# Patient Record
Sex: Male | Born: 1985 | Race: Black or African American | Hispanic: No | Marital: Single | Smoking: Current some day smoker
Health system: Southern US, Community
[De-identification: ages and names within clinical notes are randomized; demographics above are authoritative.]

## PROBLEM LIST (undated history)

## (undated) HISTORY — PX: CARDIAC SURGERY: SHX584

---
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
## 2009-03-04 ENCOUNTER — Emergency Department (HOSPITAL_COMMUNITY): Admission: EM | Admit: 2009-03-04 | Discharge: 2009-03-04 | Payer: Self-pay | Admitting: Family Medicine

## 2009-03-08 ENCOUNTER — Emergency Department (HOSPITAL_COMMUNITY): Admission: EM | Admit: 2009-03-08 | Discharge: 2009-03-08 | Payer: Self-pay | Admitting: Family Medicine

## 2012-11-09 ENCOUNTER — Emergency Department (HOSPITAL_COMMUNITY): Payer: Self-pay

## 2012-11-09 ENCOUNTER — Encounter (HOSPITAL_COMMUNITY): Payer: Self-pay | Admitting: *Deleted

## 2012-11-09 ENCOUNTER — Emergency Department (HOSPITAL_COMMUNITY)
Admission: EM | Admit: 2012-11-09 | Discharge: 2012-11-09 | Disposition: A | Discharge status: Home / Self Care | Payer: Self-pay | Attending: Emergency Medicine | Admitting: Emergency Medicine

## 2012-11-09 DIAGNOSIS — S92353A Displaced fracture of fifth metatarsal bone, unspecified foot, initial encounter for closed fracture: Secondary | ICD-10-CM

## 2012-11-09 DIAGNOSIS — Y939 Activity, unspecified: Secondary | ICD-10-CM | POA: Insufficient documentation

## 2012-11-09 DIAGNOSIS — Z87891 Personal history of nicotine dependence: Secondary | ICD-10-CM | POA: Insufficient documentation

## 2012-11-09 DIAGNOSIS — Y929 Unspecified place or not applicable: Secondary | ICD-10-CM | POA: Insufficient documentation

## 2012-11-09 DIAGNOSIS — W108XXA Fall (on) (from) other stairs and steps, initial encounter: Secondary | ICD-10-CM | POA: Insufficient documentation

## 2012-11-09 DIAGNOSIS — S92309A Fracture of unspecified metatarsal bone(s), unspecified foot, initial encounter for closed fracture: Secondary | ICD-10-CM | POA: Insufficient documentation

## 2012-11-09 MED ORDER — OXYCODONE-ACETAMINOPHEN 5-325 MG PO TABS: 2.0000 | ORAL_TABLET | ORAL | Status: AC | PRN

## 2012-11-09 NOTE — ED Notes (Signed)
Pt. Demonstrated proper technique with crutches

## 2012-11-09 NOTE — ED Notes (Signed)
To ED for eval of right foot pain past injury a cple days ago. Able to walk with limp. States he felt better once swelling decreased and then hit something with foot yesterday bringing pain back.

## 2012-11-09 NOTE — Progress Notes (Signed)
Orthopedic Tech Progress Note Patient Details:  Bill Frost 02-Oct-1986 [id number]  Ortho Devices Type of Ortho Device: Crutches;Post (short leg) splint;Ace wrap Ortho Device/Splint Location: ((R) LE Ortho Device/Splint Interventions: Application   Jennye Moccasin 11/09/2012, 4:22 PM

## 2012-11-09 NOTE — ED Provider Notes (Signed)
History  This chart was scribed for Bill Octave, MD by Bennett Scrape, ED Scribe. This patient was seen in room A10C/A10C and the patient's care was started at 3:44 PM.  CSN: [id number]  Arrival date & time 11/09/12  1236   First MD Initiated Contact with Patient 11/09/12 1544      Chief Complaint  Patient presents with  . Foot Pain     The history is provided by the patient. No language interpreter was used.    Bill Frost is a 27 y.o. male who presents to the Emergency Department complaining of 4 days of sudden onset, non-changing, constant right foot pain described as throbbing with associated swelling that started after a fall. Pt states that he missed a step and fell down 2 or 3 stairs landing with the lateral aspect of his right foot on the floor. He reports that he was barefoot at the time. He reports that he has been ambulating on the foot for the past 2 days without difficulty. He denies any prior injuries to the right foot. He denies taking OTC medications at home to improve symptoms but states that he has been elevating the foot at home with improvement in the swelling. He denies head trauma, LOC, neck pain, numbness, HA, chest pain and back pain as associated symptoms. He does not have a h/o chronic medical conditions and is a former smoker but denies alcohol use.  History reviewed. No pertinent past medical history.  History reviewed. No pertinent past surgical history.  History reviewed. No pertinent family history.  History  Substance Use Topics  . Smoking status: Former Games developer  . Smokeless tobacco: Not on file  . Alcohol Use: No      Review of Systems  A complete 10 system review of systems was obtained and all systems are negative except as noted in the HPI and PMH.   Allergies  Review of patient's allergies indicates no known allergies.  Home Medications  No current outpatient prescriptions on file.  Triage Vitals: BP 143/69  Pulse 63  Temp  97.9 F (36.6 C) (Oral)  Resp 16  SpO2 99%  Physical Exam  Nursing note and vitals reviewed. Constitutional: He is oriented to person, place, and time. He appears well-developed and well-nourished. No distress.  HENT:  Head: Normocephalic and atraumatic.  Eyes: EOM are normal.  Neck: Neck supple. No tracheal deviation present.  Cardiovascular: Normal rate.   Pulmonary/Chest: Effort normal. No respiratory distress.  Musculoskeletal: He exhibits edema and tenderness.       +2 DP and PT pulses, edema over the right lateral foot, no pain at base of 5th metatarsal, pain along 5th metatarsal shaft, no proximal fibula pain  Neurological: He is alert and oriented to person, place, and time.  Skin: Skin is warm and dry.  Psychiatric: He has a normal mood and affect. His behavior is normal.    ED Course  Procedures (including critical care time)  DIAGNOSTIC STUDIES: Oxygen Saturation is 99% on room air, normal by my interpretation.    COORDINATION OF CARE: 3:51 PM- Informed pt of radiology results. Discussed discharge plan which includes crutches and a splint with pt at bedside and pt agreed to plan. Also advised pt to follow up with orthopedic surgeon and to return if concerning symptoms develop. Pt decline pain medications  4:17 PM-Consult complete with Dr. Ranell Patrick, orthopedist. Patient case explained and discussed. He agrees to f/u with the pt in office. Call ended at 4:18 PM.  Labs  Reviewed - No data to display Dg Foot Complete Right  11/09/2012  *RADIOLOGY REPORT*  Clinical Data: Injured right foot.  RIGHT FOOT COMPLETE - 3+ VIEW  Comparison: None  Findings: There is a mildly displaced oblique coursing midshaft fracture of the fifth metatarsal.  The joint spaces are maintained. No other fractures are seen.  IMPRESSION: Fifth metatarsal shaft fracture.   Original Report Authenticated By: Rudie Meyer, M.D.      No diagnosis found.    MDM  Fall down stairs 4 days ago complaining of  right lateral foot pain. Did not hit head or lose consciousness. Denies any other injuries.  Tender to palpation over right fifth metatarsal. Intact pulses. Intact motor and sensory function. Compartments soft. Full range of motion of toes and ankle without pain.  Splint, NWB, pain control, follow up with Dr. Debby Bud.  I personally performed the services described in this documentation, which was scribed in my presence. The recorded information has been reviewed and is accurate.       Bill Octave, MD 11/09/12 [phone]

## 2012-12-11 ENCOUNTER — Ambulatory Visit: Payer: Self-pay | Admitting: Internal Medicine

## 2012-12-11 DIAGNOSIS — Z0289 Encounter for other administrative examinations: Secondary | ICD-10-CM

## 2013-05-18 ENCOUNTER — Emergency Department (HOSPITAL_COMMUNITY)
Admission: EM | Admit: 2013-05-18 | Discharge: 2013-05-18 | Disposition: A | Discharge status: Home / Self Care | Payer: Self-pay | Attending: Emergency Medicine | Admitting: Emergency Medicine

## 2013-05-18 ENCOUNTER — Emergency Department (HOSPITAL_COMMUNITY): Payer: Self-pay

## 2013-05-18 ENCOUNTER — Encounter (HOSPITAL_COMMUNITY): Payer: Self-pay | Admitting: *Deleted

## 2013-05-18 DIAGNOSIS — R112 Nausea with vomiting, unspecified: Secondary | ICD-10-CM | POA: Insufficient documentation

## 2013-05-18 DIAGNOSIS — K8020 Calculus of gallbladder without cholecystitis without obstruction: Secondary | ICD-10-CM | POA: Insufficient documentation

## 2013-05-18 DIAGNOSIS — F17200 Nicotine dependence, unspecified, uncomplicated: Secondary | ICD-10-CM | POA: Insufficient documentation

## 2013-05-18 DIAGNOSIS — R0789 Other chest pain: Secondary | ICD-10-CM | POA: Insufficient documentation

## 2013-05-18 LAB — COMPREHENSIVE METABOLIC PANEL
AST: 32 U/L (ref 0–37)
Albumin: 3.6 g/dL (ref 3.5–5.2)
BUN: 10 mg/dL (ref 6–23)
Calcium: 9.5 mg/dL (ref 8.4–10.5)
Chloride: 97 mEq/L (ref 96–112)
Creatinine, Ser: 1 mg/dL (ref 0.50–1.35)
Total Protein: 7.5 g/dL (ref 6.0–8.3)

## 2013-05-18 LAB — CBC WITH DIFFERENTIAL/PLATELET
Basophils Absolute: 0 10*3/uL (ref 0.0–0.1)
Basophils Relative: 0 % (ref 0–1)
Eosinophils Absolute: 0 10*3/uL (ref 0.0–0.7)
Eosinophils Relative: 0 % (ref 0–5)
HCT: 40.7 % (ref 39.0–52.0)
MCH: 28 pg (ref 26.0–34.0)
MCHC: 33.9 g/dL (ref 30.0–36.0)
Monocytes Absolute: 1.1 10*3/uL — ABNORMAL HIGH (ref 0.1–1.0)
Neutro Abs: 10.4 10*3/uL — ABNORMAL HIGH (ref 1.7–7.7)
RDW: 13.3 % (ref 11.5–15.5)

## 2013-05-18 LAB — POCT I-STAT TROPONIN I: Troponin i, poc: 0.01 ng/mL (ref 0.00–0.08)

## 2013-05-18 LAB — LIPASE, BLOOD: Lipase: 35 U/L (ref 11–59)

## 2013-05-18 MED ORDER — MORPHINE SULFATE 4 MG/ML IJ SOLN
4.0000 mg | Freq: Once | INTRAMUSCULAR | Status: AC
  Administered 2013-05-18: 4 mg via INTRAVENOUS
  Filled 2013-05-18: qty 1

## 2013-05-18 MED ORDER — ONDANSETRON HCL 4 MG/2ML IJ SOLN
4.0000 mg | Freq: Once | INTRAMUSCULAR | Status: AC
  Administered 2013-05-18: 4 mg via INTRAVENOUS
  Filled 2013-05-18: qty 2

## 2013-05-18 MED ORDER — HYDROCODONE-ACETAMINOPHEN 5-325 MG PO TABS: ORAL_TABLET | ORAL | Status: AC

## 2013-05-18 MED ORDER — SODIUM CHLORIDE 0.9 % IV SOLN
Freq: Once | INTRAVENOUS | Status: AC
  Administered 2013-05-18: 04:00:00 via INTRAVENOUS

## 2013-05-18 MED ORDER — KETOROLAC TROMETHAMINE 30 MG/ML IJ SOLN
30.0000 mg | Freq: Once | INTRAMUSCULAR | Status: AC
  Administered 2013-05-18: 30 mg via INTRAVENOUS
  Filled 2013-05-18: qty 1

## 2013-05-18 NOTE — ED Provider Notes (Signed)
CSN: [id number]     Arrival date & time 05/18/13  0058 History     First MD Initiated Contact with Patient 05/18/13 [phone]     Chief Complaint  Patient presents with  . Back Pain  . Chest Pain   (Consider location/radiation/quality/duration/timing/severity/associated sxs/prior Treatment) HPI Comments: Patient states he was sitting, watching TV when he suddenly had left posterior chest pain, radiating up into his neck.  That was excruciating, causing him to have 2 episodes of vomiting.  This occurred approximately one hour after eating D[employer]  for dinner.  He does not remember ever having discomfort like this.  Since arriving in the emergency room and has gotten better without any particular intervention, but he still complaining of left posterior neck discomfort. There is a family history on his mother side of hypertension, and diabetes, but no personal history of any medical conditions.  Does not take medication on a regular basis.  Has not traveled does smoke cigarettes on a daily basis  Patient is a 27 y.o. male presenting with back pain and chest pain. The history is provided by the patient.  Back Pain Quality:  Aching Radiates to:  Does not radiate Pain severity:  Moderate Onset quality:  Sudden Duration:  3 hours Timing:  Constant Progression:  Improving Chronicity:  New Context: not emotional stress, not falling, not jumping from heights, not lifting heavy objects, not occupational injury, not physical stress and not recent illness   Relieved by:  None tried Ineffective treatments:  None tried Associated symptoms: chest pain   Associated symptoms: no abdominal pain, no fever, no headaches and no weakness   Chest Pain Associated symptoms: back pain, nausea and vomiting   Associated symptoms: no abdominal pain, no cough, no dizziness, no fever, no headache, no shortness of breath and no weakness     History reviewed. No pertinent past medical history. Past Surgical History   Procedure Laterality Date  . Cardiac surgery      at age 14   History reviewed. No pertinent family history. History  Substance Use Topics  . Smoking status: Current Some Day Smoker  . Smokeless tobacco: Not on file  . Alcohol Use: Yes     Comment: rarely    Review of Systems  Constitutional: Negative for fever.  Respiratory: Negative for cough and shortness of breath.   Cardiovascular: Positive for chest pain. Negative for leg swelling.  Gastrointestinal: Positive for nausea and vomiting. Negative for abdominal pain and constipation.  Musculoskeletal: Positive for back pain.  Neurological: Negative for dizziness, weakness and headaches.  All other systems reviewed and are negative.    Allergies  Review of patient's allergies indicates no known allergies.  Home Medications   Current Outpatient Rx  Name  Route  Sig  Dispense  Refill  . oxyCODONE-acetaminophen (PERCOCET/ROXICET) 5-325 MG per tablet   Oral   Take 2 tablets by mouth every 4 (four) hours as needed for pain.   15 tablet   0   . HYDROcodone-acetaminophen (NORCO/VICODIN) 5-325 MG per tablet      Take 1-2 tablets every 6 hours as needed for severe pain   8 tablet   0    BP 128/75  Pulse 106  Temp(Src) 98 F (36.7 C) (Oral)  Resp 15  SpO2 99% Physical Exam  Nursing note and vitals reviewed. Constitutional: He is oriented to person, place, and time. He appears well-developed and well-nourished.  HENT:  Head: Normocephalic.  Eyes: Pupils are equal, round, and  reactive to light.  Neck: Normal range of motion.  Cardiovascular: Normal rate and regular rhythm.   Pulmonary/Chest: Effort normal and breath sounds normal.  Abdominal: Soft. Bowel sounds are normal. He exhibits no distension. There is no tenderness.  Musculoskeletal: Normal range of motion.  Neurological: He is alert and oriented to person, place, and time.  Skin: Skin is warm. No rash noted. No erythema.    ED Course   Procedures  (including critical care time)  Labs Reviewed  CBC WITH DIFFERENTIAL - Abnormal; Notable for the following:    WBC 12.7 (*)    Neutrophils Relative % 82 (*)    Neutro Abs 10.4 (*)    Lymphocytes Relative 9 (*)    Monocytes Absolute 1.1 (*)    All other components within normal limits  COMPREHENSIVE METABOLIC PANEL - Abnormal; Notable for the following:    Sodium 134 (*)    Glucose, Bld 132 (*)    Total Bilirubin 0.1 (*)    All other components within normal limits  LIPASE, BLOOD  POCT I-STAT TROPONIN I   Dg Chest 2 View  05/18/2013   *RADIOLOGY REPORT*  Clinical Data: Chest pain on inspiration.  CHEST - 2 VIEW  Comparison: None.  Findings: The lungs are clear.  Heart size is normal.  No pneumothorax or pleural fluid.  No focal bony abnormality.  IMPRESSION: No acute disease.   Original Report Authenticated By: Holley Dexter, M.D.   US Abdomen Complete  05/18/2013   *RADIOLOGY REPORT*  Clinical Data:  Back and chest pain.  COMPLETE ABDOMINAL ULTRASOUND  Comparison:  None.  Findings:  Gallbladder:  A single small gallstone measuring 0.7 cm in diameter is identified.  There is no gallbladder wall thickening or pericholecystic fluid.  Sonographer reports negative Murphy's sign.  Common bile duct:  Measures 0.3 cm.  Liver:  No focal lesion identified.  Within normal limits in parenchymal echogenicity.  IVC:  Appears normal.  Pancreas:  No focal abnormality seen.  Spleen:  Measures 8.1 cm and appears normal.  Right Kidney:  Measures 12.8 cm and appears normal.  Left Kidney:  Measures 10.7 cm and appears normal.  Abdominal aorta:  No aneurysm identified.  IMPRESSION: Single small gallstone.  Negative for cholecystitis.   Original Report Authenticated By: Holley Dexter, M.D.   1. Cholelithiasis     MDM     Arman Filter, NP 05/19/13 [phone]

## 2013-05-18 NOTE — ED Notes (Signed)
Pt anxious, tearful, restless, hyperventilating, c/o back pain b/w shoulder blades, onset 2 hrs ago, was just sitting in chair, also reports CP, and numbness in fingers (to elbows).

## 2013-05-18 NOTE — ED Provider Notes (Signed)
Medical screening examination/treatment/procedure(s) were performed by non-physician practitioner and as supervising physician I was immediately available for consultation/collaboration.  Olivia Mackie, MD 05/18/13 [phone]

## 2013-05-18 NOTE — ED Provider Notes (Signed)
6:05 AM Handoff from Manus Rudd NP -- patient with CP radiating to back -- awaiting [place] to r/o biliary etiology. Per previous provider, do not suspect PE, ACS. Labs show mild increase in WBC count. CXR neg. Awaiting results and then likely d/c to home.   6:27 AM Spoke with patient. Informed of gallstone. Counseled on avoidance of foods that make it worse. Reviewed results. Surgery referral given.  Patient counseled on use of narcotic pain medications. Counseled not to combine these medications with others containing tylenol. Urged not to drink alcohol, drive, or perform any other activities that requires focus while taking these medications. The patient verbalizes understanding and agrees with the plan.  Re-exam: pt NAD; abd soft, NT.   The patient was urged to return to the Emergency Department immediately with worsening of current symptoms, worsening abdominal pain, persistent vomiting, blood noted in stools, fever, or any other concerns. The patient verbalized understanding.     Renne Crigler, PA-C 05/18/13 [phone]

## 2013-05-19 NOTE — ED Provider Notes (Signed)
Medical screening examination/treatment/procedure(s) were performed by non-physician practitioner and as supervising physician I was immediately available for consultation/collaboration.  Olivia Mackie, MD 05/19/13 [phone]

## 2014-02-05 IMAGING — CR DG FOOT COMPLETE 3+V*R*
3 series · 3 of 3 positions shown · non-contrast
Comparison: None

CLINICAL DATA: Injured right foot.

RIGHT FOOT COMPLETE - 3+ VIEW

[t foot ap right]
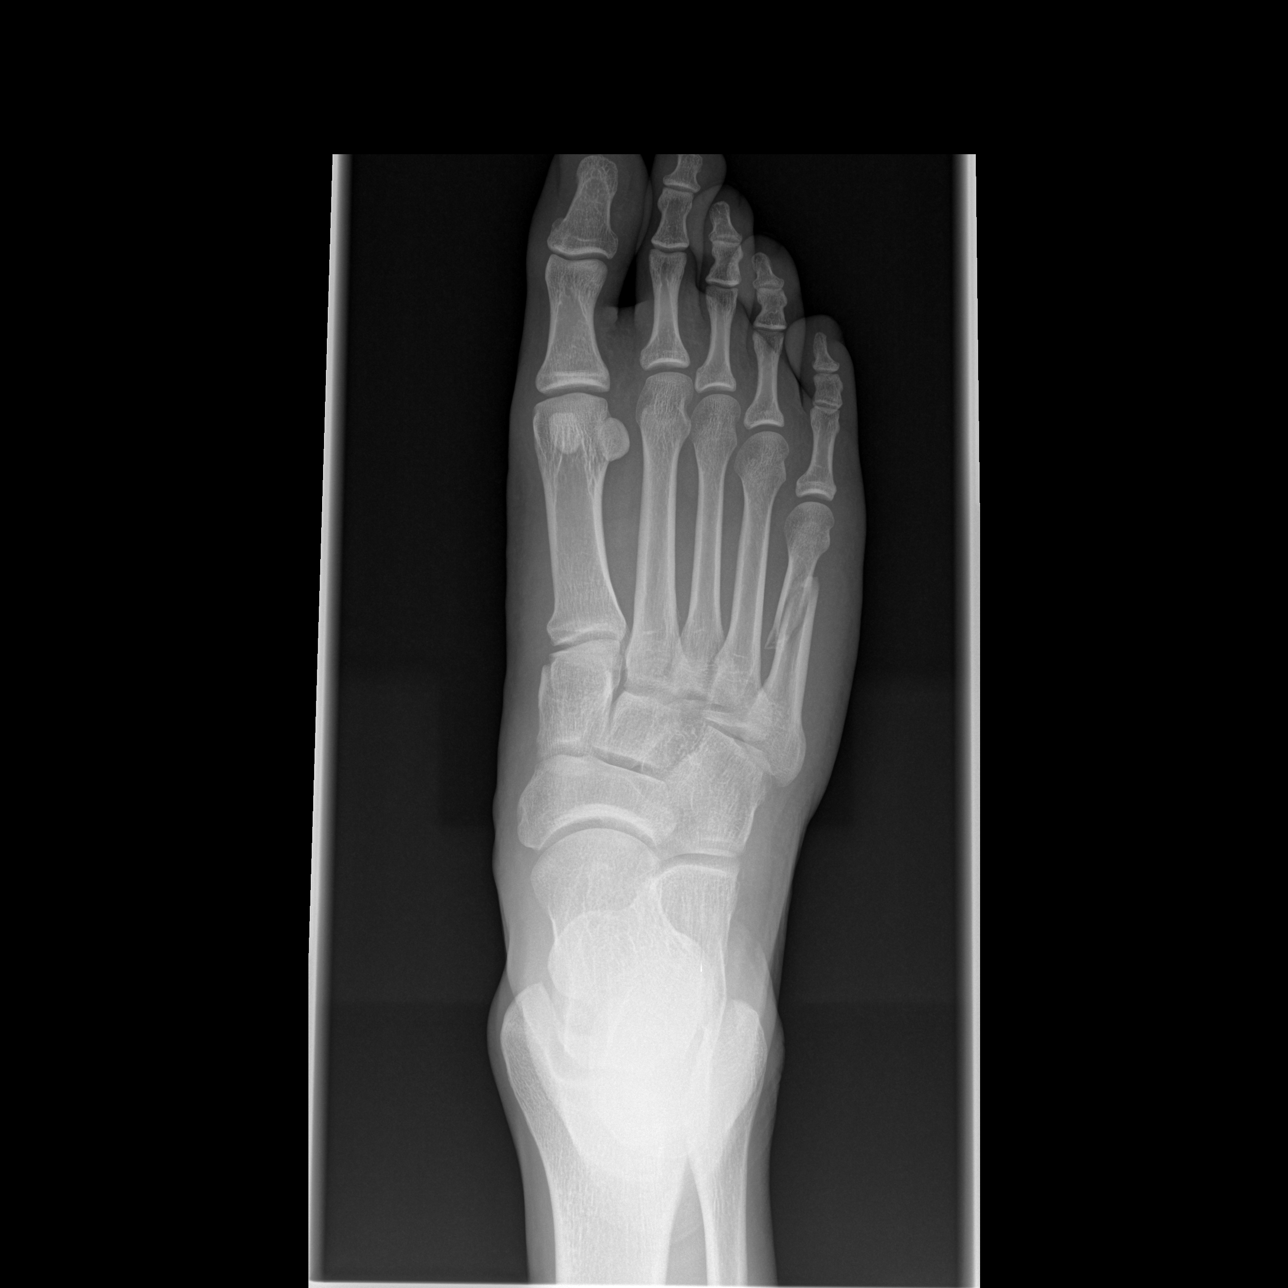

[t foot oblique right]
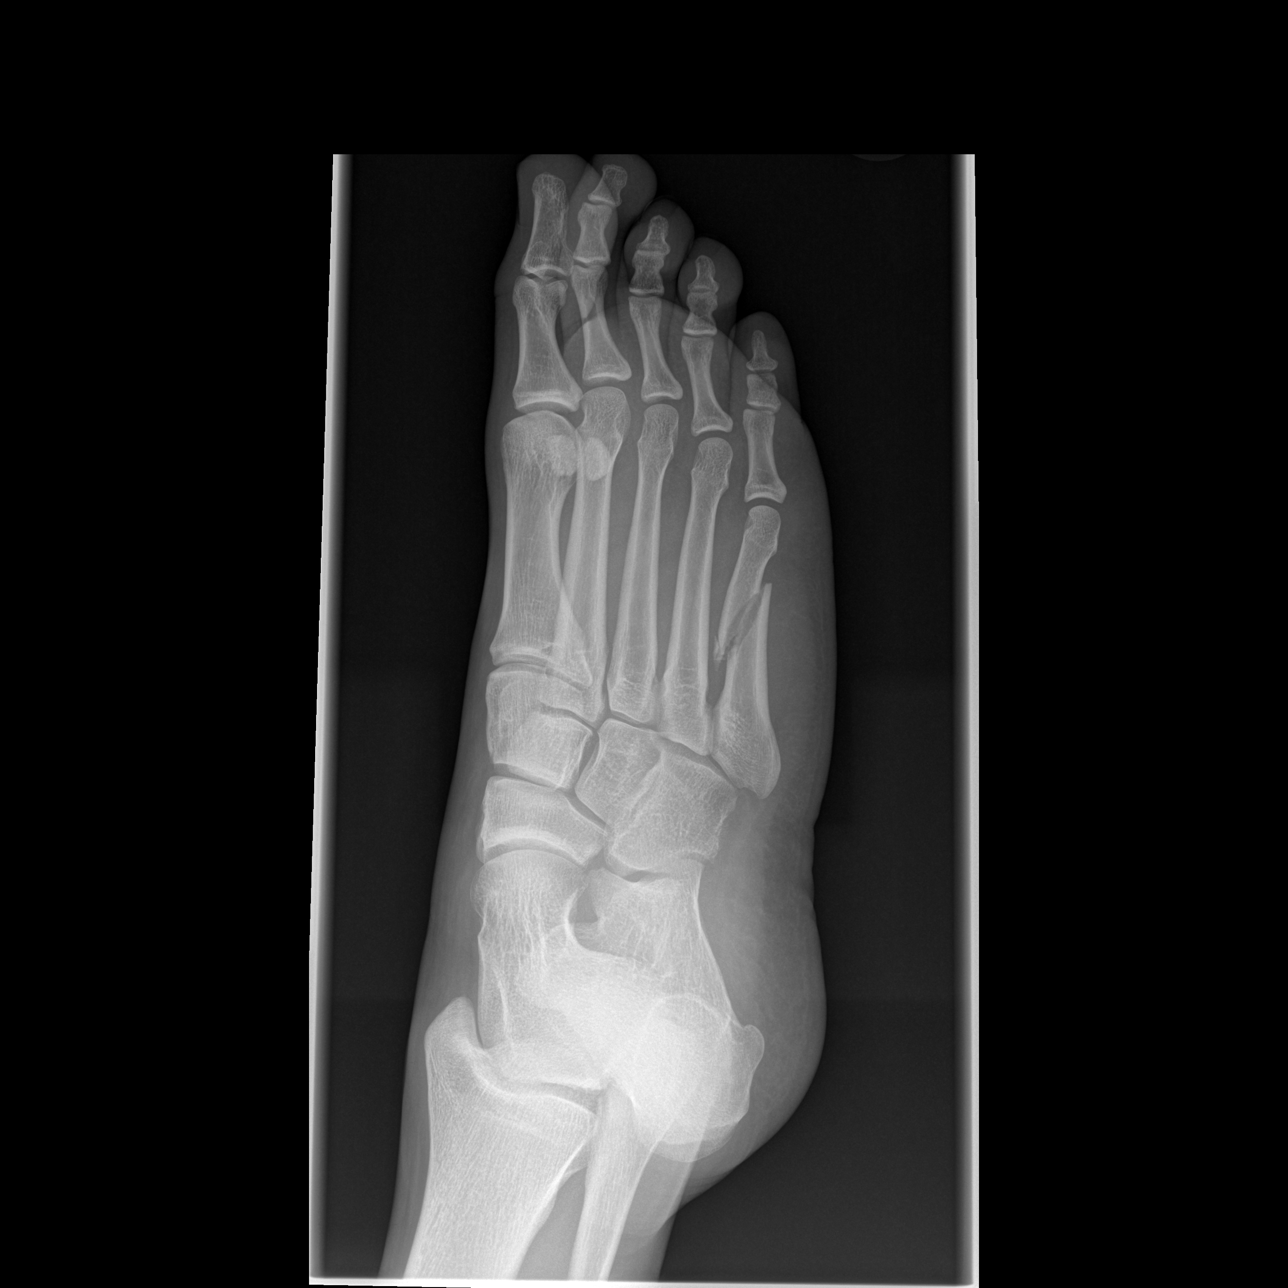

[t foot lat right]
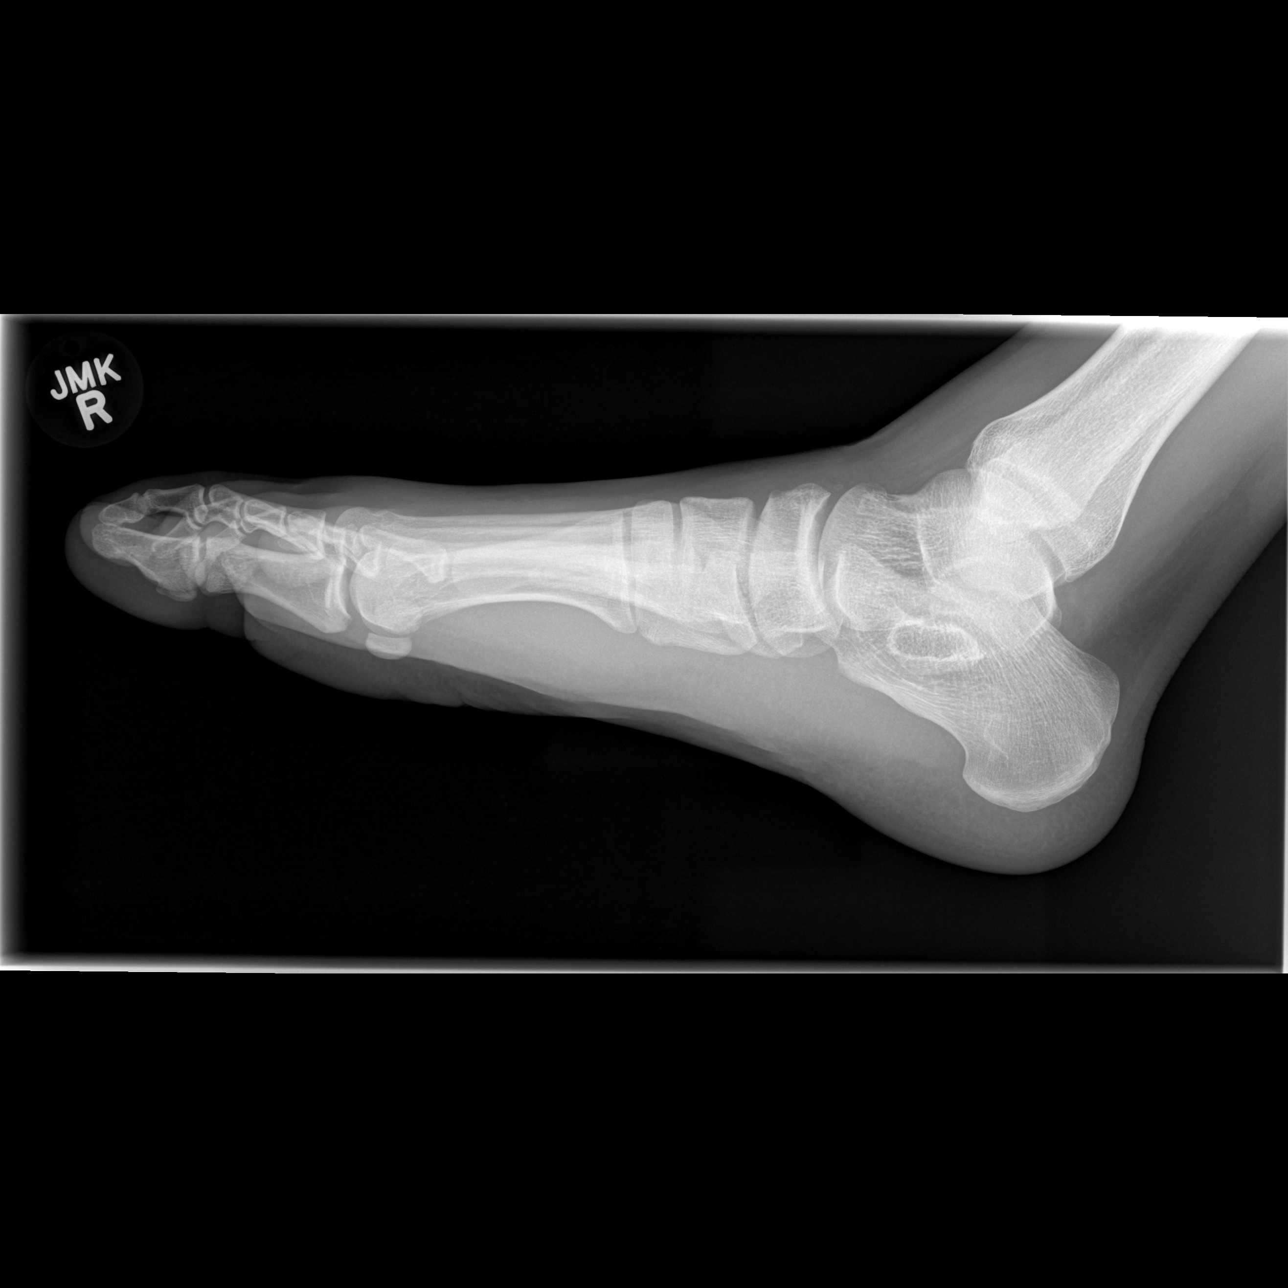

[3 of 3 positions shown; findings below may reference images not displayed]

FINDINGS: There is a mildly displaced oblique coursing midshaft
fracture of the fifth metatarsal.  The joint spaces are maintained.
No other fractures are seen.
IMPRESSION: Fifth metatarsal shaft fracture.

## 2014-08-14 IMAGING — CR DG CHEST 2V
2 series · 2 of 2 positions shown · non-contrast
Comparison: None.

CLINICAL DATA: Chest pain on inspiration.

CHEST - 2 VIEW

[w chest pa]
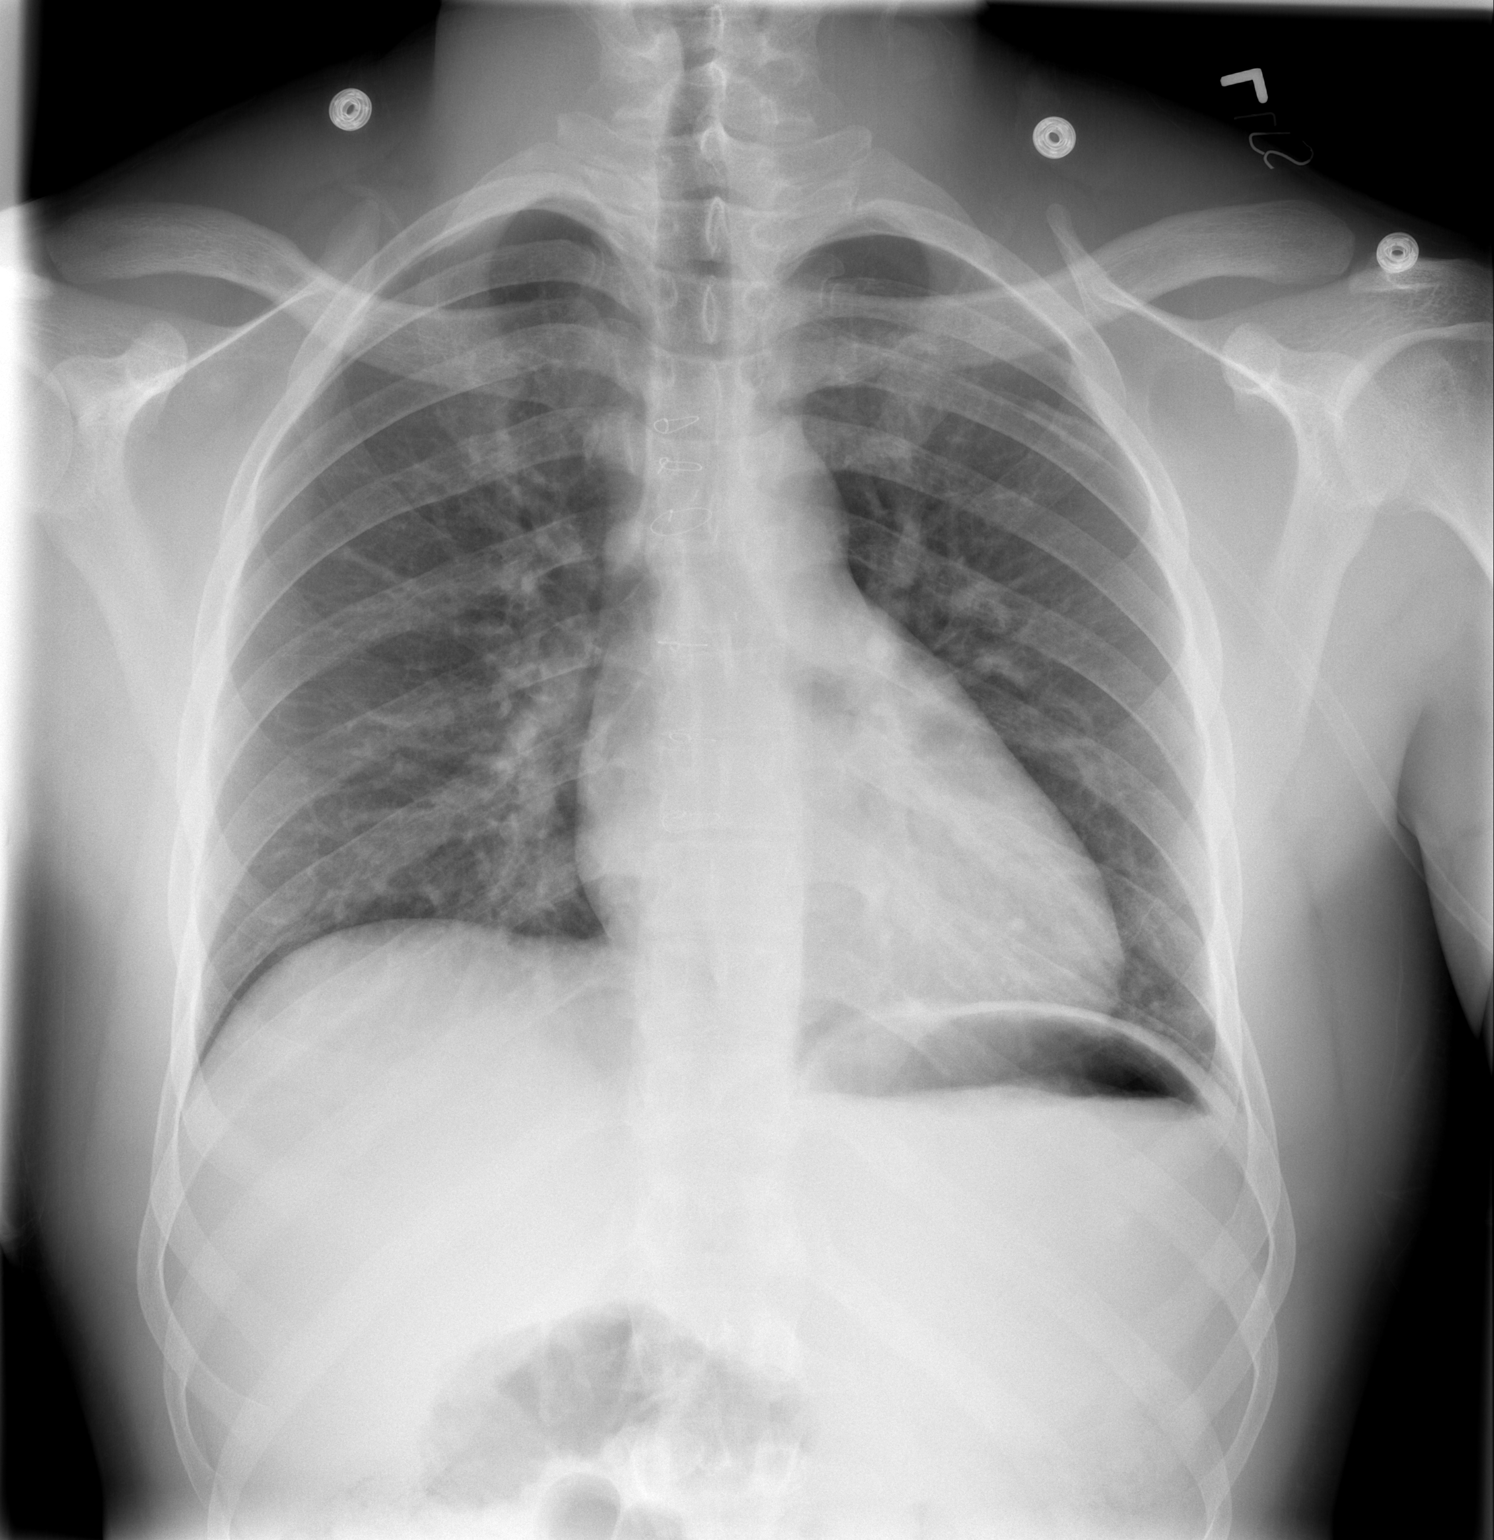

[w chest lat]
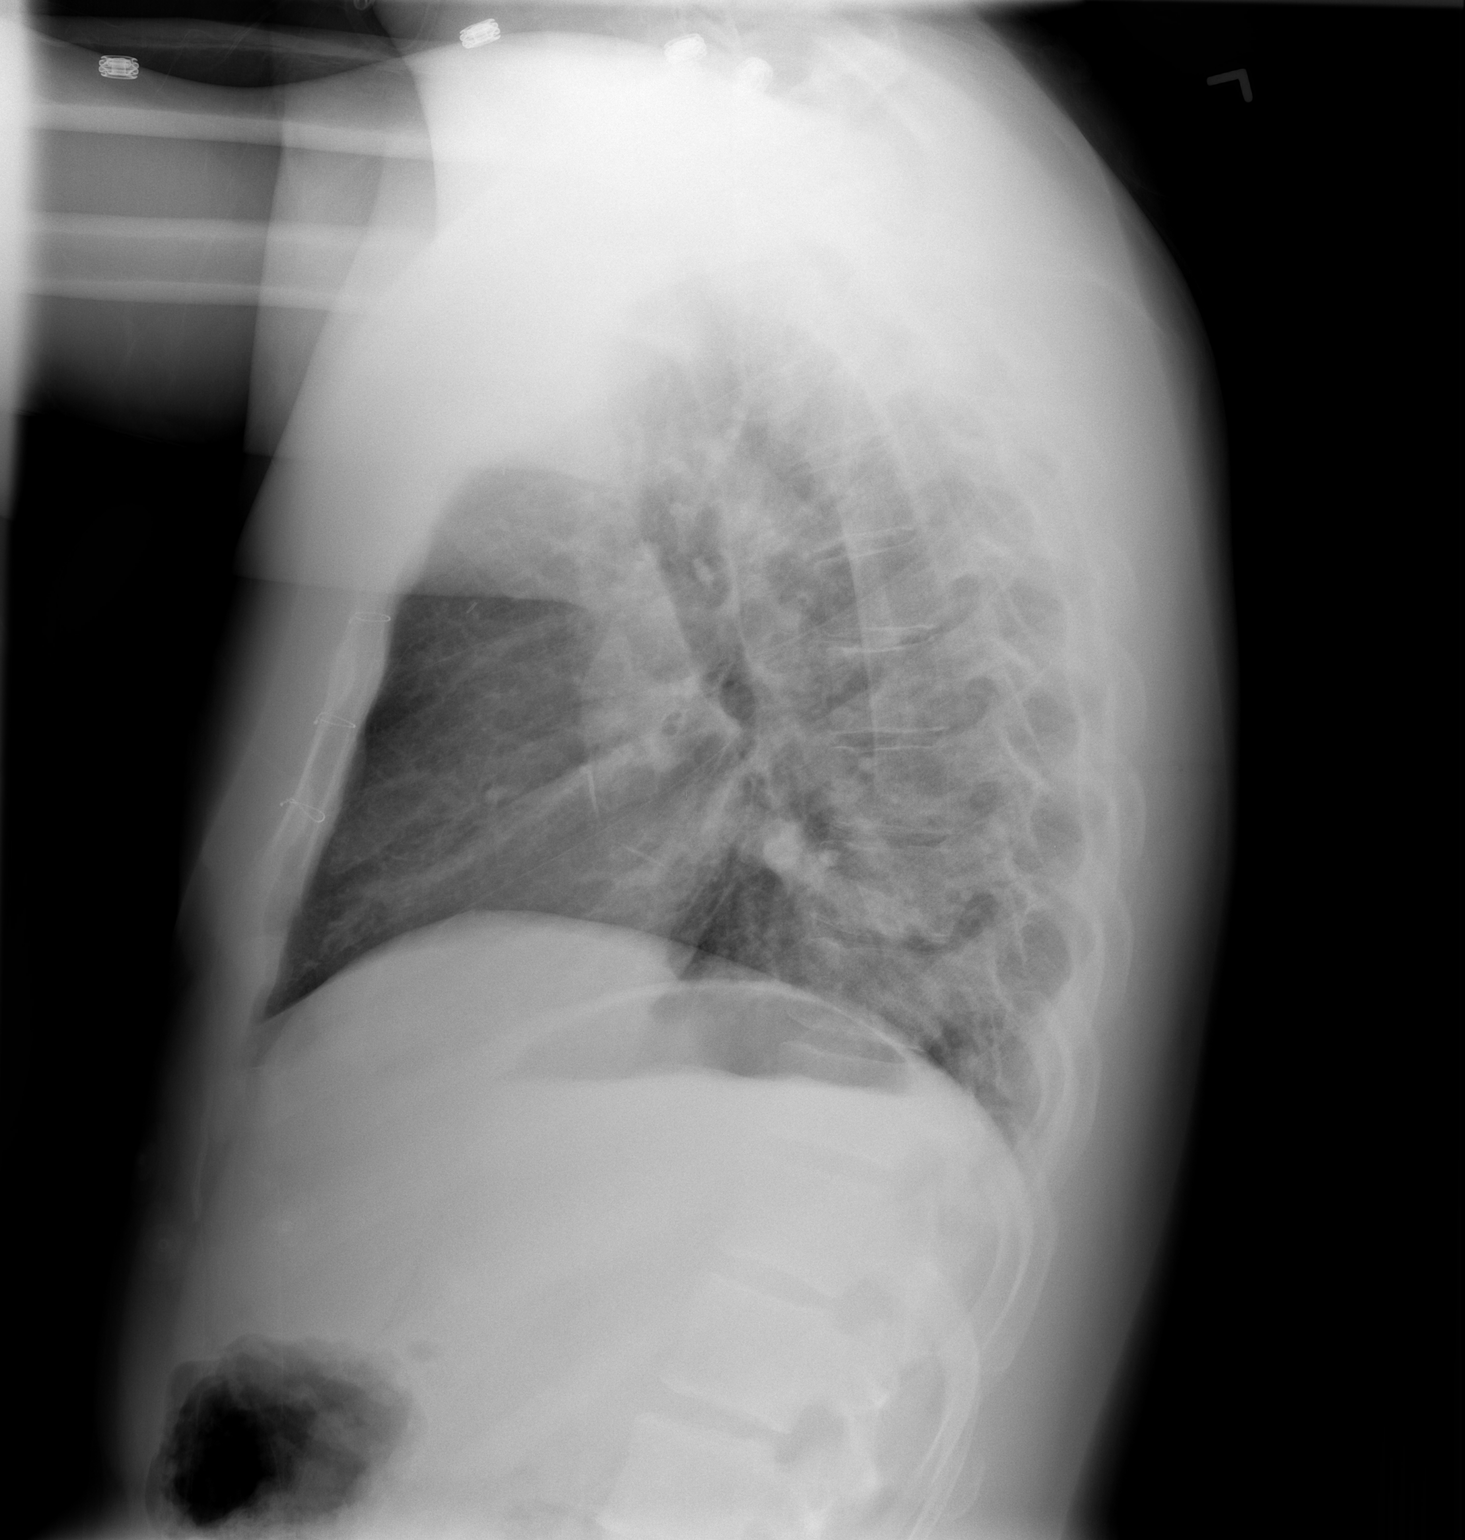

[2 of 2 positions shown; findings below may reference images not displayed]

FINDINGS: The lungs are clear.  Heart size is normal.  No
pneumothorax or pleural fluid.  No focal bony abnormality.
IMPRESSION: No acute disease.

## 2014-08-14 IMAGING — US US ABDOMEN COMPLETE
1 series · 14 of 25 positions shown · non-contrast
Comparison: None.

CLINICAL DATA: Back and chest pain.

COMPLETE ABDOMINAL ULTRASOUND

[Series 1: us abdomen complete · 0.26mm/px · 14 of 108 slices shown]
[im 1/108]
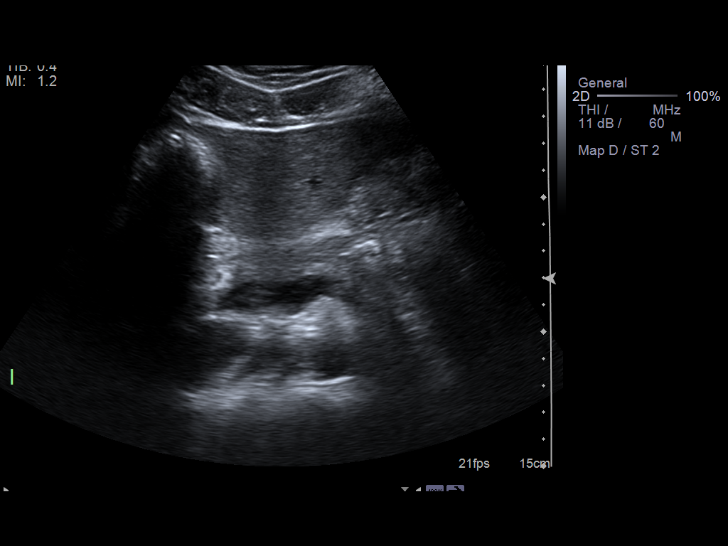
[im 9/108]
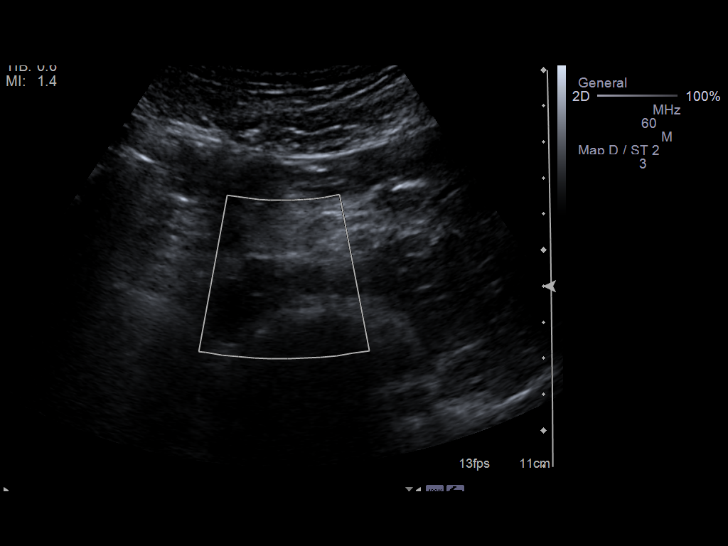
[im 18/108]
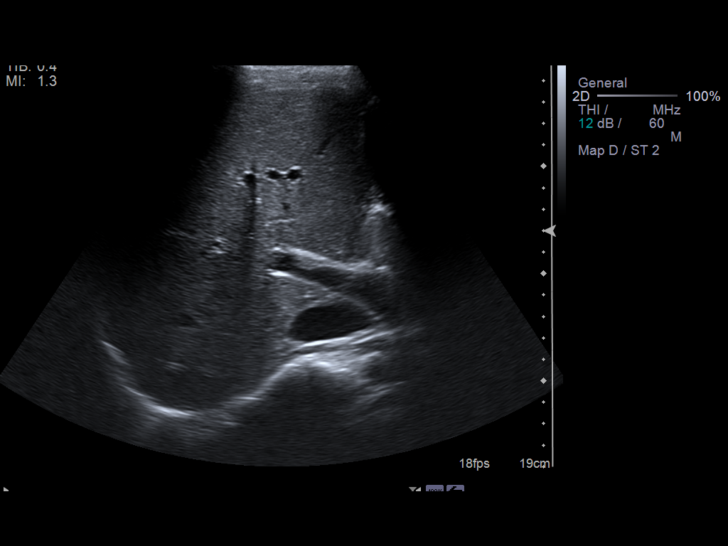
[im 27/108]
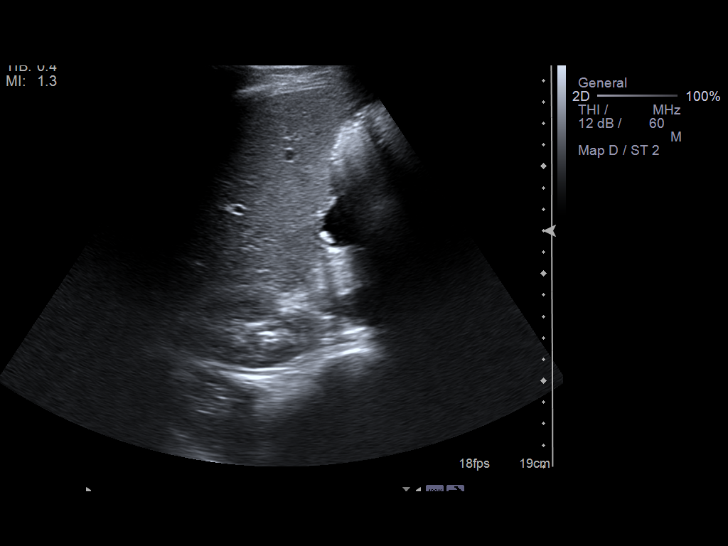
[im 36/108]
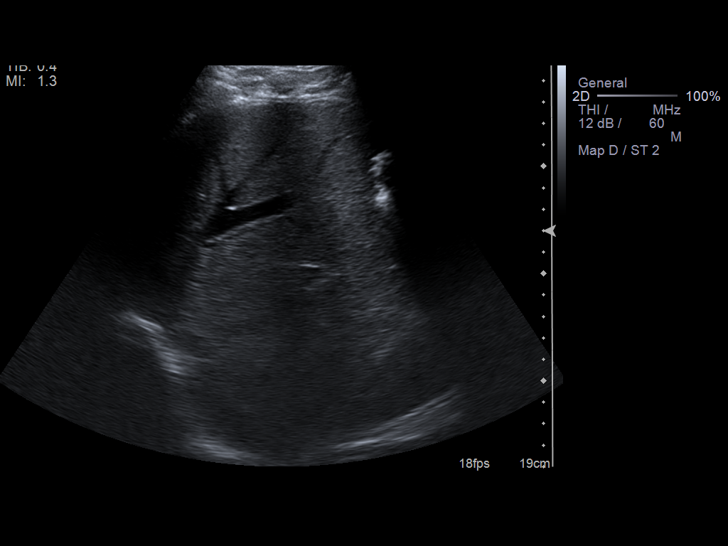
[im 41/108]
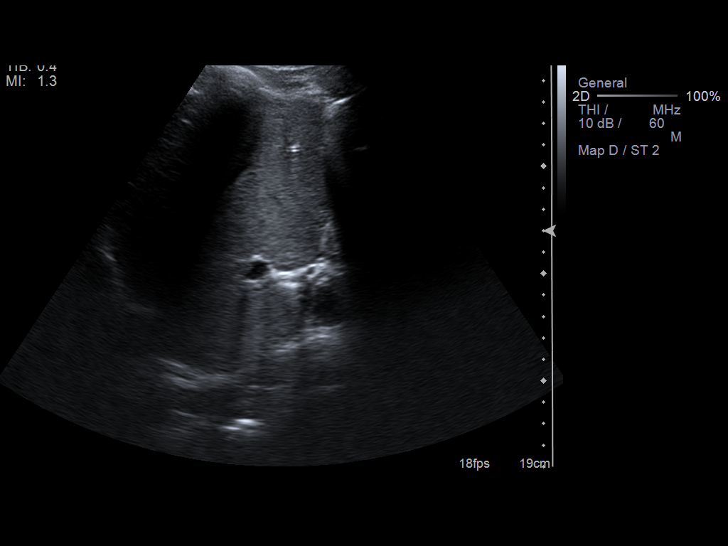
[im 50/108]
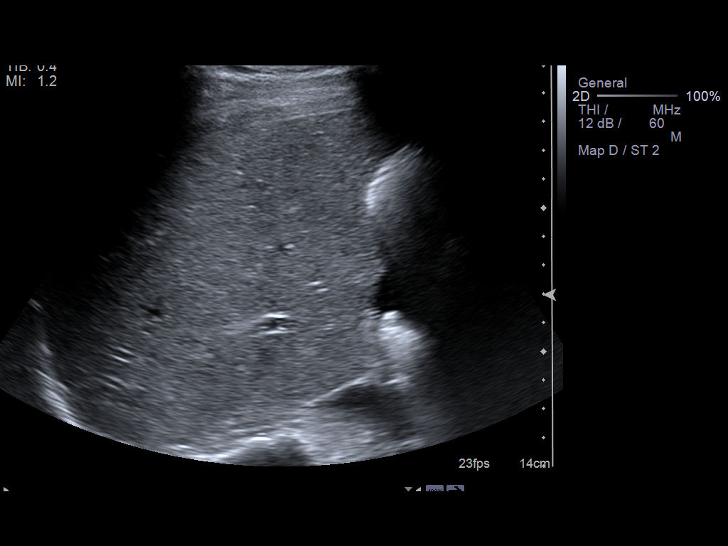
[im 58/108]
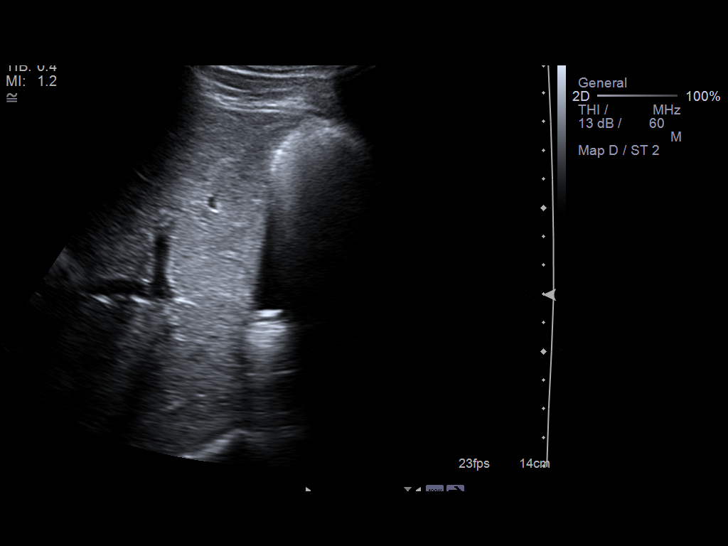
[im 67/108]
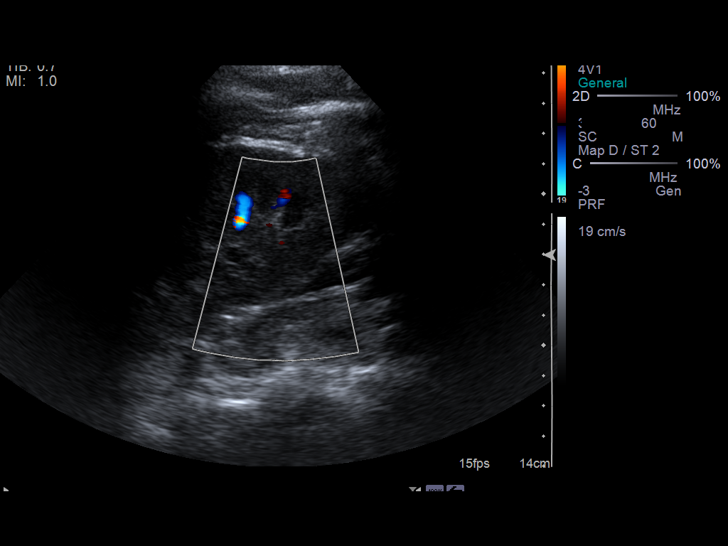
[im 72/108]
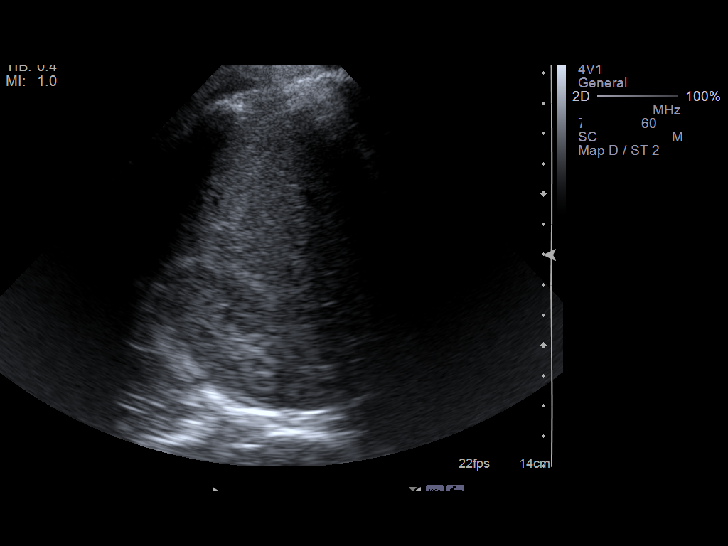
[im 81/108]
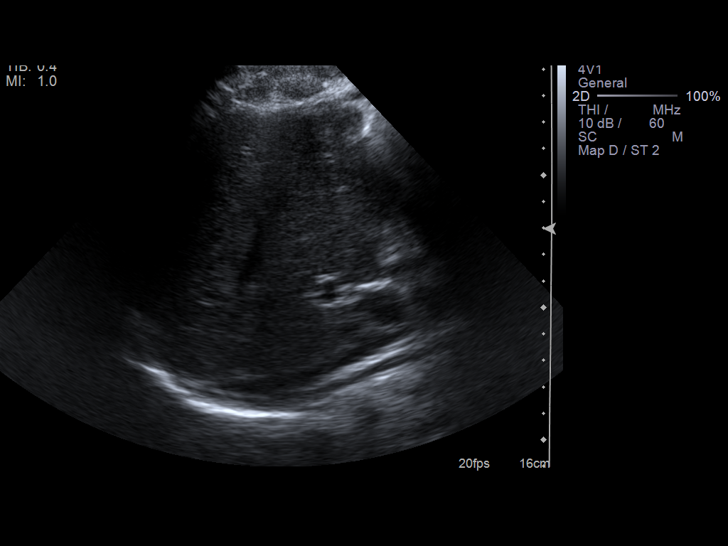
[im 90/108]
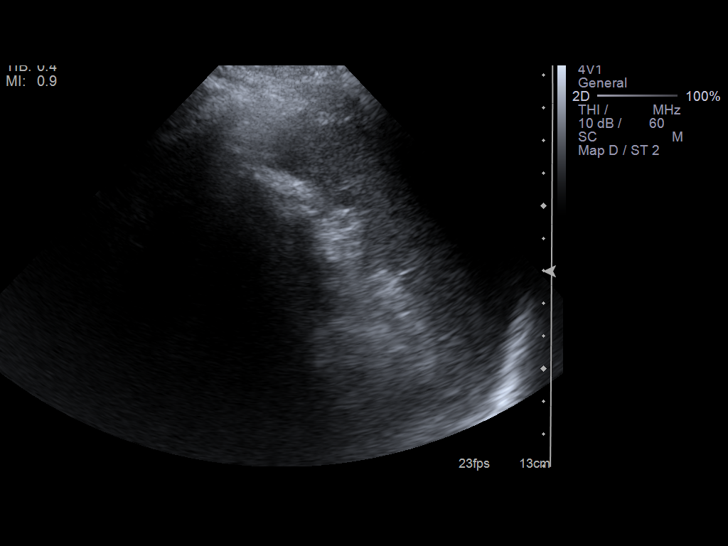
[im 99/108]
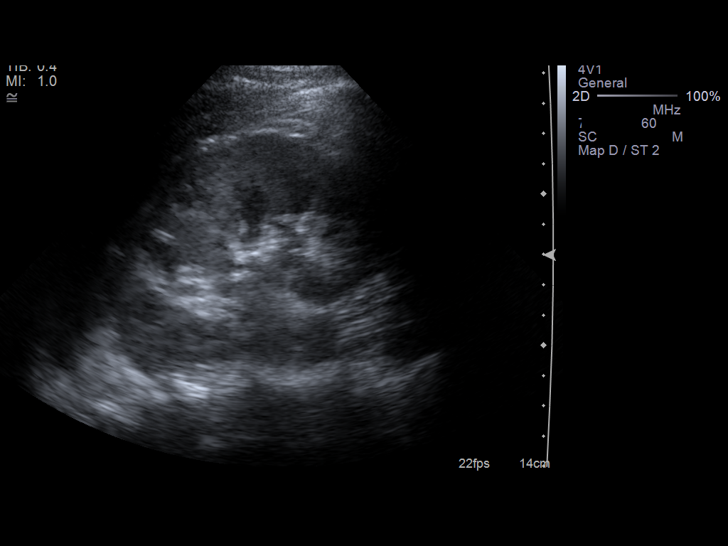
[im 108/108]
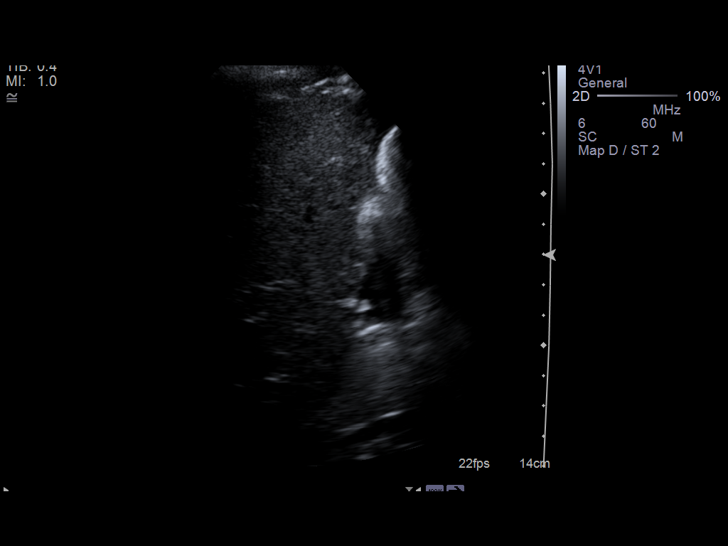

[14 of 25 positions shown; findings below may reference images not displayed]

FINDINGS: Gallbladder:  A single small gallstone measuring 0.7 cm in diameter
is identified.  There is no gallbladder wall thickening or
pericholecystic fluid.  Sonographer reports negative Murphy's sign.

Common bile duct:  Measures 0.3 cm.

Liver:  No focal lesion identified.  Within normal limits in
parenchymal echogenicity.

IVC:  Appears normal.

Pancreas:  No focal abnormality seen.

Spleen:  Measures 8.1 cm and appears normal.

Right Kidney:  Measures 12.8 cm and appears normal.

Left Kidney:  Measures 10.7 cm and appears normal.

Abdominal aorta:  No aneurysm identified.
IMPRESSION: Single small gallstone.  Negative for cholecystitis.

## 2019-12-31 ENCOUNTER — Ambulatory Visit: Payer: Self-pay | Attending: Internal Medicine

## 2019-12-31 DIAGNOSIS — Z23 Encounter for immunization: Secondary | ICD-10-CM

## 2020-01-25 ENCOUNTER — Ambulatory Visit: Payer: Self-pay | Attending: Internal Medicine

## 2020-01-25 DIAGNOSIS — Z23 Encounter for immunization: Secondary | ICD-10-CM

## 2020-01-25 NOTE — Progress Notes (Signed)
   Cov[facility] Vaccination [facility]  Name:  Bill Frost    MRN: [id number] DOB: January 09, 1986  01/25/2020  Bill Frost was observed post Covid-19 immunization for 15 minutes without incident. He was provided with Vaccine Information Sheet and instruction to access the V-Safe system.   Bill Frost was instructed to call 911 with any severe reactions post vaccine: Marland Kitchen Difficulty breathing  . Swelling of face and throat  . A fast heartbeat  . A bad rash all over body  . Dizziness and weakness   Immunizations Administered    Name Date Dose VIS Date Route   Pfizer COVID-19 Vaccine 01/25/2020  1:43 PM 0.3 mL 09/24/2019 Intramuscular   Manufacturer: [employer], [employer]   Lot: [id number]   NDC: [address]-2

## 2020-10-11 ENCOUNTER — Other Ambulatory Visit: Payer: Self-pay

## 2020-10-11 ENCOUNTER — Emergency Department (HOSPITAL_BASED_OUTPATIENT_CLINIC_OR_DEPARTMENT_OTHER): Admission: EM | Admit: 2020-10-11 | Discharge: 2020-10-11 | Discharge status: Against Medical Advice | Payer: Self-pay
